# Patient Record
Sex: Male | Born: 2000 | Race: White | Hispanic: No | Marital: Single | Smoking: Never smoker
Health system: Southern US, Community
[De-identification: ages and names within clinical notes are randomized; demographics above are authoritative.]

---
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
## 2019-02-11 ENCOUNTER — Encounter: Payer: Self-pay | Admitting: Family Medicine

## 2019-02-11 ENCOUNTER — Ambulatory Visit
Admission: EM | Admit: 2019-02-11 | Discharge: 2019-02-11 | Disposition: A | Discharge status: Home / Self Care | Payer: No Typology Code available for payment source | Attending: Family Medicine | Admitting: Family Medicine

## 2019-02-11 ENCOUNTER — Other Ambulatory Visit: Payer: Self-pay

## 2019-02-11 DIAGNOSIS — H9312 Tinnitus, left ear: Secondary | ICD-10-CM

## 2019-02-11 DIAGNOSIS — H6122 Impacted cerumen, left ear: Secondary | ICD-10-CM | POA: Diagnosis not present

## 2019-02-11 DIAGNOSIS — H60392 Other infective otitis externa, left ear: Secondary | ICD-10-CM

## 2019-02-11 MED ORDER — NEOMYCIN-POLYMYXIN-HC 3.5-10000-1 OT SUSP: 4.0000 [drp] | Freq: Three times a day (TID) | OTIC | 1 refills | Status: AC

## 2019-02-11 NOTE — ED Triage Notes (Signed)
Pt c/o lt earache with ringing and pain for the past 11 days, worse at night

## 2019-02-11 NOTE — ED Provider Notes (Signed)
[facility]    CSN: [id number] Arrival date & time: 02/11/19  1226     History   Chief Complaint Chief Complaint  Patient presents with  . Otalgia    HPI Allen Gillespie is a 18 y.o. male.   18 yo man, established [facility] patient, presents with otalgia for the last 11 days, left ear only.  No fever, shortness of breath.  Some ringing.  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     History reviewed. No pertinent past medical history.  There are no active problems to display for this patient.   History reviewed. No pertinent surgical history.     Home Medications    Prior to Admission medications   Medication Sig Start Date End Date Taking? Authorizing Provider  neomycin-polymyxin-hydrocortisone (CORTISPORIN) 3.5-10000-1 OTIC suspension Place 4 drops into the left ear 3 (three) times daily. 02/11/19   Elvina Sidle, MD    Family History No family history on file.  Social History Social History   Tobacco Use  . Smoking status: Never Smoker  . Smokeless tobacco: Never Used  Substance Use Topics  . Alcohol use: Not on file  . Drug use: Not Currently     Allergies   Patient has no known allergies.   Review of Systems Review of Systems  HENT: Positive for ear pain and tinnitus.   All other systems reviewed and are negative.    Physical Exam Triage Vital Signs ED Triage Vitals  Enc Vitals Group     BP      Pulse      Resp      Temp      Temp src      SpO2      Weight      Height      Head Circumference      Peak Flow      Pain Score      Pain Loc      Pain Edu?      Excl. in GC?    No data found.  Updated Vital Signs BP (!) 132/92 (BP Location: Left Arm)   Pulse 74   Temp 97.9 F (36.6 C) (Oral)   Resp 18   SpO2 96%    Physical Exam Vitals signs and nursing note reviewed.  Constitutional:      Appearance: Normal appearance. He is normal weight.  HENT:     Head: Normocephalic  and atraumatic.     Right Ear: Tympanic membrane, ear canal and external ear normal.     Ears:     Comments: Cerumen impaction left side No pain with manipulation of pinna    Mouth/Throat:     Mouth: Mucous membranes are moist.  Eyes:     Conjunctiva/sclera: Conjunctivae normal.  Neck:     Musculoskeletal: Normal range of motion and neck supple.  Cardiovascular:     Rate and Rhythm: Normal rate.  Pulmonary:     Effort: Pulmonary effort is normal.  Musculoskeletal: Normal range of motion.  Skin:    General: Skin is warm and dry.  Neurological:     General: No focal deficit present.     Mental Status: He is alert and oriented to person, place, and time.    Left ear irrigated revealing normal TM but diffuse erythema and adherent exudates with erythema  UC Treatments / Results  Labs (all labs ordered are listed, but only abnormal results are  displayed) Labs Reviewed - No data to display  EKG None  Radiology No results found.  Procedures Procedures (including critical care time)  Medications Ordered in UC Medications - No data to display  Initial Impression / Assessment and Plan / UC Course  I have reviewed the triage vital signs and the nursing notes.  Pertinent labs & imaging results that were available during my care of the patient were reviewed by me and considered in my medical decision making (see chart for details).    Final Clinical Impressions(s) / UC Diagnoses   Final diagnoses:  Impacted cerumen of left ear  Tinnitus of left ear  Infective otitis externa of left ear   Discharge Instructions   None    ED Prescriptions    Medication Sig Dispense Auth. Provider   neomycin-polymyxin-hydrocortisone (CORTISPORIN) 3.5-10000-1 OTIC suspension Place 4 drops into the left ear 3 (three) times daily. 10 mL Elvina SidleLauenstein, Tyara Dassow, MD     Controlled Substance Prescriptions [place] Controlled Substance Registry consulted? Not Applicable   Elvina SidleLauenstein, Merdis Snodgrass, MD 02/11/19  1256

## 2020-01-13 ENCOUNTER — Ambulatory Visit: Payer: Self-pay | Attending: Internal Medicine

## 2020-01-13 DIAGNOSIS — Z23 Encounter for immunization: Secondary | ICD-10-CM

## 2020-01-13 NOTE — Progress Notes (Signed)
   Covid-19 Vaccination Clinic  Name:  Allen Gillespie    MRN: [id number] DOB: Nov 25, 2000  01/13/2020  [name] was observed post Covid-19 immunization for 15 minutes without incident. He was provided with Vaccine Information Sheet and instruction to access the V-Safe system.   [name] was instructed to call 911 with any severe reactions post vaccine: Marland Kitchen Difficulty breathing  . Swelling of face and throat  . A fast heartbeat  . A bad rash all over body  . Dizziness and weakness   Immunizations Administered    Name Date Dose VIS Date Route   Pfizer COVID-19 Vaccine 01/13/2020  4:19 PM 0.3 mL 09/24/2019 Intramuscular   Manufacturer: [employer], [employer]   Lot: [id number]   NDC: [address]-9

## 2020-02-08 ENCOUNTER — Ambulatory Visit: Payer: Self-pay | Attending: Internal Medicine

## 2020-02-08 DIAGNOSIS — Z23 Encounter for immunization: Secondary | ICD-10-CM

## 2020-02-08 NOTE — Progress Notes (Signed)
   Covid-19 Vaccination Clinic  Name:  Allen Gillespie    MRN: [id number] DOB: [birth year]  02/08/2020  Mr. Allen Gillespie was observed post Covid-19 immunization for 15 minutes without incident. He was provided with Vaccine Information Sheet and instruction to access the V-Safe system.   Mr. Allen Gillespie was instructed to call 911 with any severe reactions post vaccine: Marland Kitchen Difficulty breathing  . Swelling of face and throat  . A fast heartbeat  . A bad rash all over body  . Dizziness and weakness   Immunizations Administered    Name Date Dose VIS Date Route   Pfizer COVID-19 Vaccine 02/08/2020  3:57 PM 0.3 mL 12/08/2018 Intramuscular   Manufacturer: [employer], [employer]   Lot: [id number]   NDC: [address]-4

## 2020-04-24 IMAGING — DX DG FOOT COMPLETE 3+V*R*
3 series · 3 of 3 positions shown · non-contrast
Comparison: None.

CLINICAL DATA: 17 y/o M; lateral foot pain and bruising at the base
of fifth metatarsal.

EXAM:
RIGHT FOOT COMPLETE - 3+ VIEW

[foot supine dp]
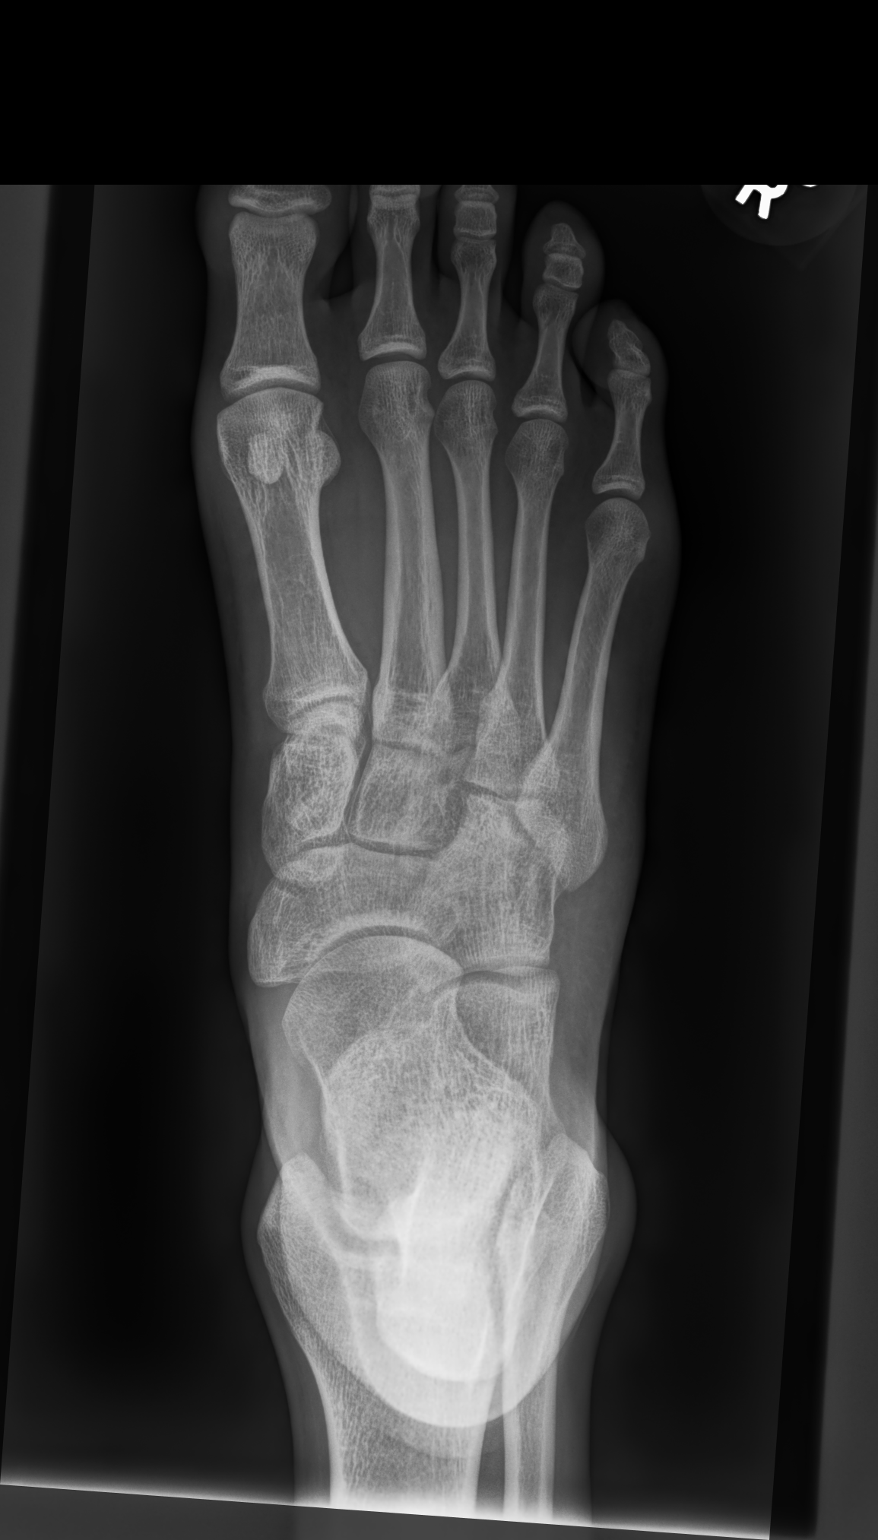

[foot medial oblique]
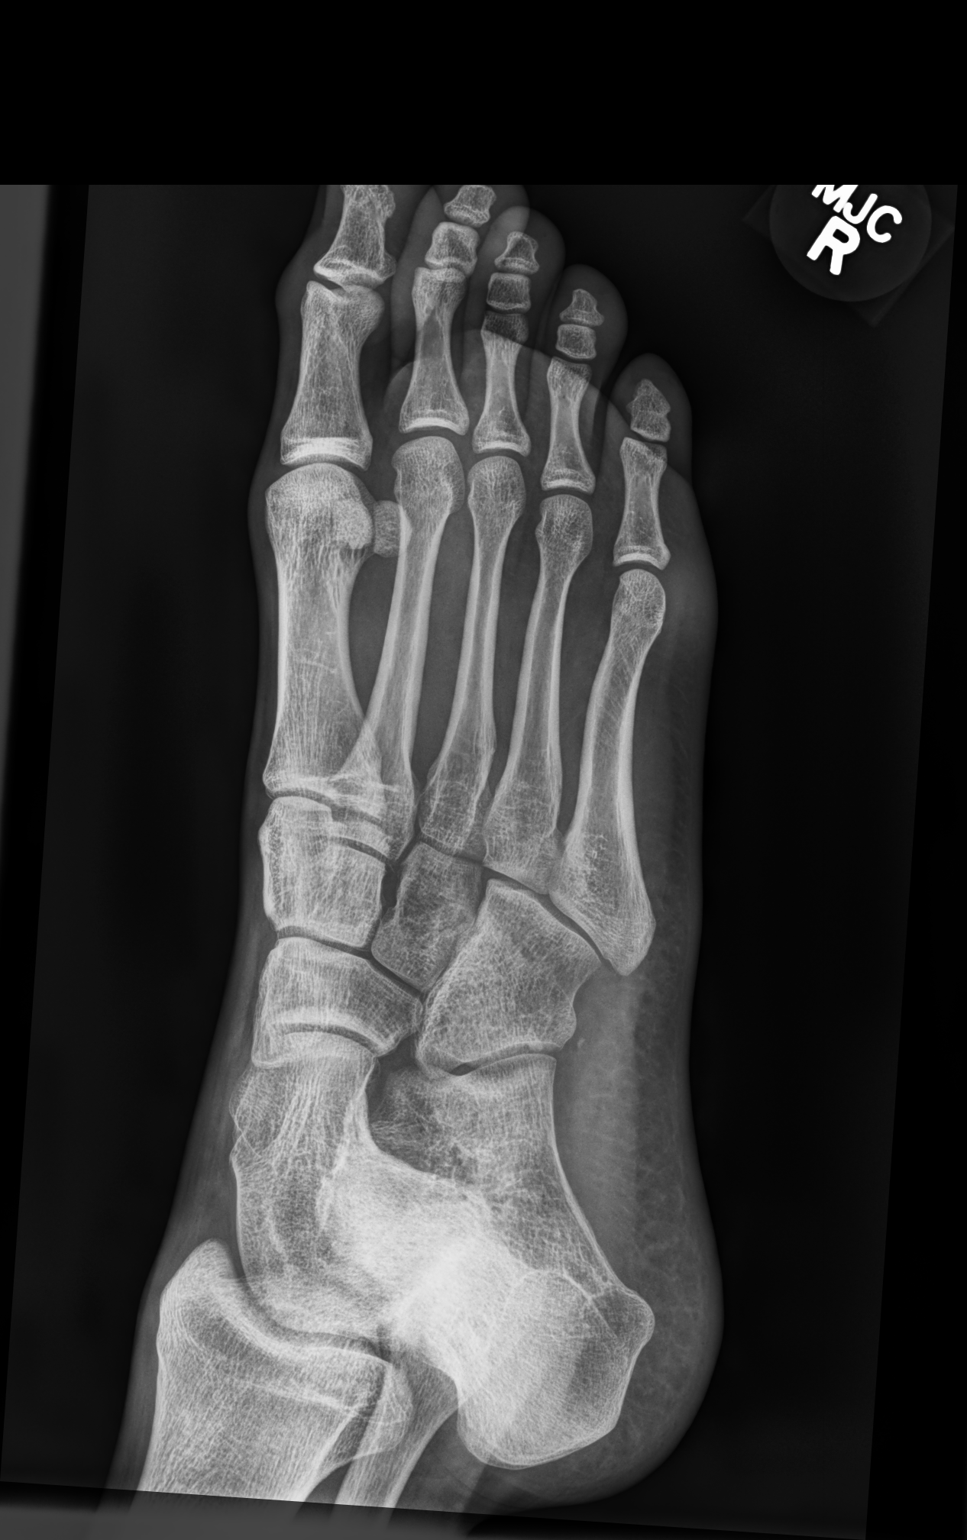

[foot supine lat]
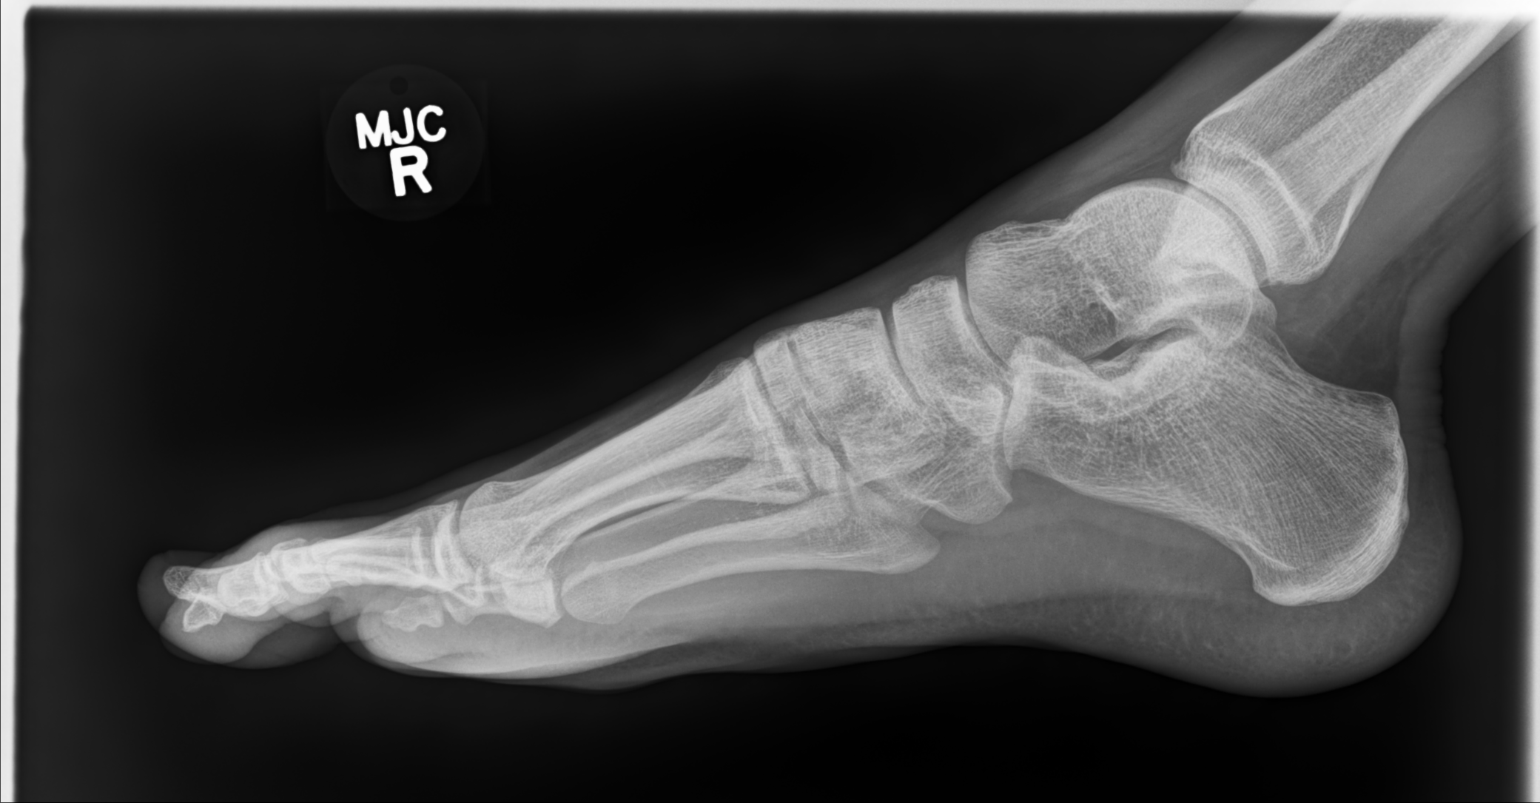

[3 of 3 positions shown; findings below may reference images not displayed]

FINDINGS: There is no evidence of fracture or dislocation. There is no
evidence of arthropathy or other focal bone abnormality. Soft
tissues are unremarkable.
IMPRESSION: Negative.
